# Patient Record
Sex: Female | Born: 1960 | Race: White | Hispanic: No | Marital: Married | Smoking: Never smoker
Health system: Southern US, Community
[De-identification: ages and names within clinical notes are randomized; demographics above are authoritative.]

## PROBLEM LIST (undated history)

## (undated) DIAGNOSIS — Z923 Personal history of irradiation: Secondary | ICD-10-CM

## (undated) DIAGNOSIS — Z9221 Personal history of antineoplastic chemotherapy: Secondary | ICD-10-CM

## (undated) DIAGNOSIS — C801 Malignant (primary) neoplasm, unspecified: Secondary | ICD-10-CM

---

## 2006-03-14 HISTORY — PX: REDUCTION MAMMAPLASTY: SUR839

## 2006-07-18 ENCOUNTER — Ambulatory Visit: Payer: Self-pay

## 2007-07-31 ENCOUNTER — Ambulatory Visit: Payer: Self-pay

## 2007-08-16 ENCOUNTER — Ambulatory Visit: Payer: Self-pay

## 2007-08-27 ENCOUNTER — Ambulatory Visit: Payer: Self-pay

## 2008-06-05 ENCOUNTER — Ambulatory Visit: Payer: Self-pay

## 2009-06-11 ENCOUNTER — Ambulatory Visit: Payer: Self-pay | Admitting: Family Medicine

## 2010-06-15 ENCOUNTER — Ambulatory Visit: Payer: Self-pay | Admitting: Family Medicine

## 2012-03-08 ENCOUNTER — Emergency Department (INDEPENDENT_AMBULATORY_CARE_PROVIDER_SITE_OTHER)
Admission: EM | Admit: 2012-03-08 | Discharge: 2012-03-08 | Disposition: A | Discharge status: Home / Self Care | Payer: 59 | Source: Home / Self Care

## 2012-03-08 ENCOUNTER — Encounter (HOSPITAL_COMMUNITY): Payer: Self-pay

## 2012-03-08 DIAGNOSIS — J040 Acute laryngitis: Secondary | ICD-10-CM

## 2012-03-08 DIAGNOSIS — J069 Acute upper respiratory infection, unspecified: Secondary | ICD-10-CM

## 2012-03-08 MED ORDER — PHENYLEPHRINE-CHLORPHEN-DM 10-4-12.5 MG/5ML PO LIQD: 5.0000 mL | ORAL | Status: DC | PRN

## 2012-03-08 NOTE — ED Provider Notes (Signed)
History     CSN: [id number]  Arrival date & time 03/08/12  1005   None     Chief Complaint  Patient presents with  . URI    (Consider location/radiation/quality/duration/timing/severity/associated sxs/prior treatment) HPI Comments: 51 year old female presents with coughing congestion in the chest for 3-4 days. She is complaining of throat discomfort laryngitis and pain in the chest upon coughing. States yesterday she coughed all day long. She is taking Mucinex. She also feels PND. She is a nonsmoker. Denies fevers, shortness of breath or GI/GU symptoms.   History reviewed. No pertinent past medical history.  History reviewed. No pertinent past surgical history.  History reviewed. No pertinent family history.  History  Substance Use Topics  . Smoking status: Not on file  . Smokeless tobacco: Not on file  . Alcohol Use: Not on file    OB History    Grav Para Term Preterm Abortions TAB SAB Ect Mult Living                  Review of Systems  Constitutional: Negative for fever, chills, activity change, appetite change and fatigue.  HENT: Positive for congestion, sore throat and postnasal drip. Negative for facial swelling, neck pain and neck stiffness.   Eyes: Negative.   Respiratory: Positive for cough.   Cardiovascular: Negative.   Gastrointestinal: Positive for nausea.  Musculoskeletal: Negative.   Skin: Negative for pallor and rash.  Neurological: Negative.     Allergies  Review of patient's allergies indicates no known allergies.  Home Medications   Current Outpatient Rx  Name  Route  Sig  Dispense  Refill  . PHENYLEPHRINE-CHLORPHEN-DM 12-16-10.5 MG/5ML PO LIQD   Oral   Take 5 mLs by mouth every 4 (four) hours as needed.   120 mL   0     BP 100/63  Pulse 72  Temp 98.5 F (36.9 C) (Oral)  Resp 12  SpO2 97%  Physical Exam  Nursing note and vitals reviewed. Constitutional: She is oriented to person, place, and time. She appears well-developed and  well-nourished. No distress.  HENT:       Bilateral TMs are without erythema, or bulging. Both have a degree of retraction. Oropharynx with minor erythema, no exudates Voice is raspy, and now. No stridor  Eyes: Conjunctivae normal and EOM are normal.  Neck: Normal range of motion. Neck supple.  Cardiovascular: Normal rate, regular rhythm and normal heart sounds.   Pulmonary/Chest: Effort normal and breath sounds normal. No respiratory distress. She has no wheezes. She has no rales.       Lungs are perfectly clear no adventitious sounds are heard. Air movement is excellent; inspiratory phase equals expiratory phase; no areas where there is a decreased air movement  Musculoskeletal: Normal range of motion. She exhibits no edema.  Lymphadenopathy:    She has no cervical adenopathy.  Neurological: She is alert and oriented to person, place, and time.  Skin: Skin is warm and dry. No rash noted.  Psychiatric: She has a normal mood and affect.    ED Course  Procedures (including critical care time)  Labs Reviewed - No data to display No results found.   1. URI (upper respiratory infection)       MDM  Norell CS 1 teaspoon every 4 hours when necessary cough and congestion. Drink plenty of fluids stay well hydrated Tylenol every 4 hours or Motrin every 6 hours Local rest Cepacol lozenges as needed. Per any new symptoms problems or worsening especially  shortness of breath, fever, chills and worsening cough may need to be reevaluated.        Hayden Rasmussen, NP 03/08/12 1047

## 2012-03-08 NOTE — ED Provider Notes (Signed)
Medical screening examination/treatment/procedure(s) were performed by non-physician practitioner and as supervising physician I was immediately available for consultation/collaboration.  Leslee Home, M.D.   Reuben Likes, MD 03/08/12 [phone]

## 2012-03-08 NOTE — ED Notes (Signed)
Cough , congestion, hoarse from cough, chest , throat, ear pain

## 2012-12-18 ENCOUNTER — Ambulatory Visit: Payer: Self-pay | Admitting: Family Medicine

## 2014-01-22 ENCOUNTER — Ambulatory Visit: Payer: Self-pay | Admitting: Family Medicine

## 2014-02-03 ENCOUNTER — Ambulatory Visit: Payer: Self-pay | Admitting: Family Medicine

## 2014-07-17 ENCOUNTER — Other Ambulatory Visit: Payer: Self-pay | Admitting: Family Medicine

## 2014-07-17 DIAGNOSIS — R928 Other abnormal and inconclusive findings on diagnostic imaging of breast: Secondary | ICD-10-CM

## 2014-07-18 ENCOUNTER — Other Ambulatory Visit: Payer: Self-pay | Admitting: Family Medicine

## 2014-07-18 DIAGNOSIS — R928 Other abnormal and inconclusive findings on diagnostic imaging of breast: Secondary | ICD-10-CM

## 2014-07-30 ENCOUNTER — Ambulatory Visit
Admission: RE | Admit: 2014-07-30 | Discharge: 2014-07-30 | Disposition: A | Discharge status: Home / Self Care | Payer: 59 | Source: Ambulatory Visit | Attending: Family Medicine | Admitting: Family Medicine

## 2014-07-30 ENCOUNTER — Other Ambulatory Visit: Payer: Self-pay | Admitting: Family Medicine

## 2014-07-30 DIAGNOSIS — R928 Other abnormal and inconclusive findings on diagnostic imaging of breast: Secondary | ICD-10-CM

## 2014-07-30 DIAGNOSIS — N63 Unspecified lump in breast: Secondary | ICD-10-CM | POA: Insufficient documentation

## 2014-08-25 ENCOUNTER — Ambulatory Visit (INDEPENDENT_AMBULATORY_CARE_PROVIDER_SITE_OTHER): Payer: 59

## 2014-08-25 ENCOUNTER — Ambulatory Visit (INDEPENDENT_AMBULATORY_CARE_PROVIDER_SITE_OTHER): Payer: 59 | Admitting: Podiatry

## 2014-08-25 ENCOUNTER — Encounter: Payer: Self-pay | Admitting: Podiatry

## 2014-08-25 VITALS — BP 101/61 | HR 63 | Resp 16

## 2014-08-25 DIAGNOSIS — M79673 Pain in unspecified foot: Secondary | ICD-10-CM

## 2014-08-25 DIAGNOSIS — M722 Plantar fascial fibromatosis: Secondary | ICD-10-CM

## 2014-08-25 NOTE — Progress Notes (Signed)
   Subjective:    Patient ID: Judy Stein, female    DOB: [birth year], 54 y.o.   MRN: [id number]  HPI Comments: "I need new orthotics"  Patient states that she needs new inserts for shoes. She gets twinges of pain in the right foot. She had problems with plantar fasciitis previously. She does get these blisters on bottoms of both feet and is concerned why.      Review of Systems  All other systems reviewed and are negative.      Objective:   Physical Exam: I have reviewed past medical history medications allergies surgeries and social history. Pulses are palpable bilateral. Neurologic sensorium is intact. Deep tendon reflexes are intact bilateral and muscle strength +5 over 5 dorsi plexus lateral flexors and inverters and everters all into the musculature is intact. Orthopedic evaluation of strength all joints distal to the ankle for range of motion without crepitation. Mild tenderness on palpation medial calcaneal tubercle bilateral.        Assessment & Plan:  Assessment: History of plantar fasciitis bilateral.  Plan: She'll scan for new set of orthotics.

## 2014-09-09 ENCOUNTER — Ambulatory Visit (INDEPENDENT_AMBULATORY_CARE_PROVIDER_SITE_OTHER): Payer: 59 | Admitting: *Deleted

## 2014-09-09 DIAGNOSIS — M722 Plantar fascial fibromatosis: Secondary | ICD-10-CM

## 2014-09-09 NOTE — Patient Instructions (Signed)

## 2014-09-09 NOTE — Progress Notes (Signed)
Orthotics dispensed. Instructions given. Recheck in 1 mo., if needed.

## 2014-09-24 ENCOUNTER — Telehealth: Payer: Self-pay | Admitting: *Deleted

## 2014-09-24 NOTE — Telephone Encounter (Signed)
Patient presents with her new orthotics stating that they were to long when they were dispensed she was told to take them home and try them in other shoes, the are too long for any shoe she owns and is afraid to trim them herself.  Orthotics are trimmed slightly and tried in both pairs of shoes she has with her the fit snuggly and patient left satisfied with outcome.  MParry

## 2014-10-13 ENCOUNTER — Ambulatory Visit: Payer: 59 | Admitting: Podiatry

## 2015-08-19 ENCOUNTER — Other Ambulatory Visit: Payer: Self-pay | Admitting: Internal Medicine

## 2015-08-19 DIAGNOSIS — Z1231 Encounter for screening mammogram for malignant neoplasm of breast: Secondary | ICD-10-CM

## 2015-08-24 ENCOUNTER — Ambulatory Visit
Admission: RE | Admit: 2015-08-24 | Discharge: 2015-08-24 | Disposition: A | Discharge status: Home / Self Care | Payer: 59 | Source: Ambulatory Visit | Attending: Internal Medicine | Admitting: Internal Medicine

## 2015-08-24 DIAGNOSIS — Z1231 Encounter for screening mammogram for malignant neoplasm of breast: Secondary | ICD-10-CM | POA: Insufficient documentation

## 2016-08-17 ENCOUNTER — Other Ambulatory Visit: Payer: Self-pay | Admitting: Internal Medicine

## 2016-08-17 DIAGNOSIS — Z1231 Encounter for screening mammogram for malignant neoplasm of breast: Secondary | ICD-10-CM

## 2016-08-29 ENCOUNTER — Ambulatory Visit
Admission: RE | Admit: 2016-08-29 | Discharge: 2016-08-29 | Disposition: A | Discharge status: Home / Self Care | Payer: 59 | Source: Ambulatory Visit | Attending: Internal Medicine | Admitting: Internal Medicine

## 2016-08-29 DIAGNOSIS — Z1231 Encounter for screening mammogram for malignant neoplasm of breast: Secondary | ICD-10-CM | POA: Insufficient documentation

## 2017-07-31 ENCOUNTER — Other Ambulatory Visit: Payer: Self-pay | Admitting: Internal Medicine

## 2017-07-31 DIAGNOSIS — Z1231 Encounter for screening mammogram for malignant neoplasm of breast: Secondary | ICD-10-CM

## 2017-09-04 ENCOUNTER — Ambulatory Visit
Admission: RE | Admit: 2017-09-04 | Discharge: 2017-09-04 | Disposition: A | Discharge status: Home / Self Care | Payer: 59 | Source: Ambulatory Visit | Attending: Internal Medicine | Admitting: Internal Medicine

## 2017-09-04 DIAGNOSIS — Z1231 Encounter for screening mammogram for malignant neoplasm of breast: Secondary | ICD-10-CM | POA: Insufficient documentation

## 2018-08-14 ENCOUNTER — Other Ambulatory Visit: Payer: Self-pay | Admitting: Internal Medicine

## 2018-08-14 DIAGNOSIS — Z1231 Encounter for screening mammogram for malignant neoplasm of breast: Secondary | ICD-10-CM

## 2018-09-06 ENCOUNTER — Other Ambulatory Visit: Payer: Self-pay

## 2018-09-06 ENCOUNTER — Ambulatory Visit
Admission: RE | Admit: 2018-09-06 | Discharge: 2018-09-06 | Disposition: A | Discharge status: Home / Self Care | Payer: 59 | Source: Ambulatory Visit | Attending: Internal Medicine | Admitting: Internal Medicine

## 2018-09-06 DIAGNOSIS — Z1231 Encounter for screening mammogram for malignant neoplasm of breast: Secondary | ICD-10-CM | POA: Insufficient documentation

## 2020-03-24 ENCOUNTER — Other Ambulatory Visit: Payer: Self-pay | Admitting: Internal Medicine

## 2020-03-24 DIAGNOSIS — Z1231 Encounter for screening mammogram for malignant neoplasm of breast: Secondary | ICD-10-CM

## 2020-04-14 ENCOUNTER — Encounter (INDEPENDENT_AMBULATORY_CARE_PROVIDER_SITE_OTHER): Payer: Self-pay

## 2020-04-14 ENCOUNTER — Other Ambulatory Visit: Payer: Self-pay

## 2020-04-14 ENCOUNTER — Ambulatory Visit
Admission: RE | Admit: 2020-04-14 | Discharge: 2020-04-14 | Disposition: A | Discharge status: Home / Self Care | Payer: 59 | Source: Ambulatory Visit | Attending: Internal Medicine | Admitting: Internal Medicine

## 2020-04-14 DIAGNOSIS — Z1231 Encounter for screening mammogram for malignant neoplasm of breast: Secondary | ICD-10-CM

## 2020-04-14 HISTORY — DX: Malignant (primary) neoplasm, unspecified: C80.1

## 2020-04-14 HISTORY — DX: Personal history of antineoplastic chemotherapy: Z92.21

## 2020-04-14 HISTORY — DX: Personal history of irradiation: Z92.3

## 2021-03-31 ENCOUNTER — Other Ambulatory Visit: Payer: Self-pay | Admitting: Internal Medicine

## 2021-03-31 DIAGNOSIS — Z1231 Encounter for screening mammogram for malignant neoplasm of breast: Secondary | ICD-10-CM

## 2021-04-28 ENCOUNTER — Ambulatory Visit
Admission: RE | Admit: 2021-04-28 | Discharge: 2021-04-28 | Disposition: A | Discharge status: Home / Self Care | Payer: 59 | Source: Ambulatory Visit | Attending: Internal Medicine | Admitting: Internal Medicine

## 2021-04-28 ENCOUNTER — Other Ambulatory Visit: Payer: Self-pay

## 2021-04-28 DIAGNOSIS — Z1231 Encounter for screening mammogram for malignant neoplasm of breast: Secondary | ICD-10-CM | POA: Diagnosis not present

## 2022-04-12 ENCOUNTER — Other Ambulatory Visit: Payer: Self-pay | Admitting: Internal Medicine

## 2022-04-12 DIAGNOSIS — Z1231 Encounter for screening mammogram for malignant neoplasm of breast: Secondary | ICD-10-CM

## 2022-05-03 ENCOUNTER — Ambulatory Visit
Admission: RE | Admit: 2022-05-03 | Discharge: 2022-05-03 | Disposition: A | Discharge status: Home / Self Care | Payer: 59 | Source: Ambulatory Visit | Attending: Internal Medicine | Admitting: Internal Medicine

## 2022-05-03 DIAGNOSIS — Z1231 Encounter for screening mammogram for malignant neoplasm of breast: Secondary | ICD-10-CM | POA: Insufficient documentation

## 2022-05-24 IMAGING — MG MM DIGITAL SCREENING BILAT W/ TOMO AND CAD
8 series · 8 of 24 positions shown · non-contrast
Comparison: Previous exam(s).

CLINICAL DATA: Screening.

EXAM:
DIGITAL SCREENING BILATERAL MAMMOGRAM WITH TOMOSYNTHESIS AND CAD
TECHNIQUE: Bilateral screening digital craniocaudal and mediolateral oblique
mammograms were obtained. Bilateral screening digital breast
tomosynthesis was performed. The images were evaluated with
computer-aided detection.

[L CC synth-2D]
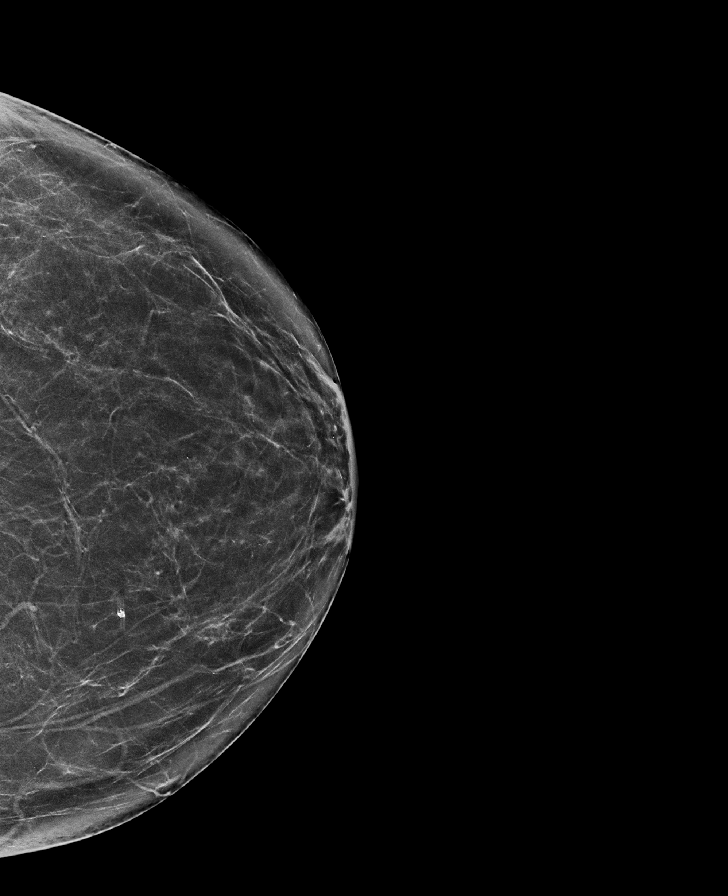

[R CC synth-2D]
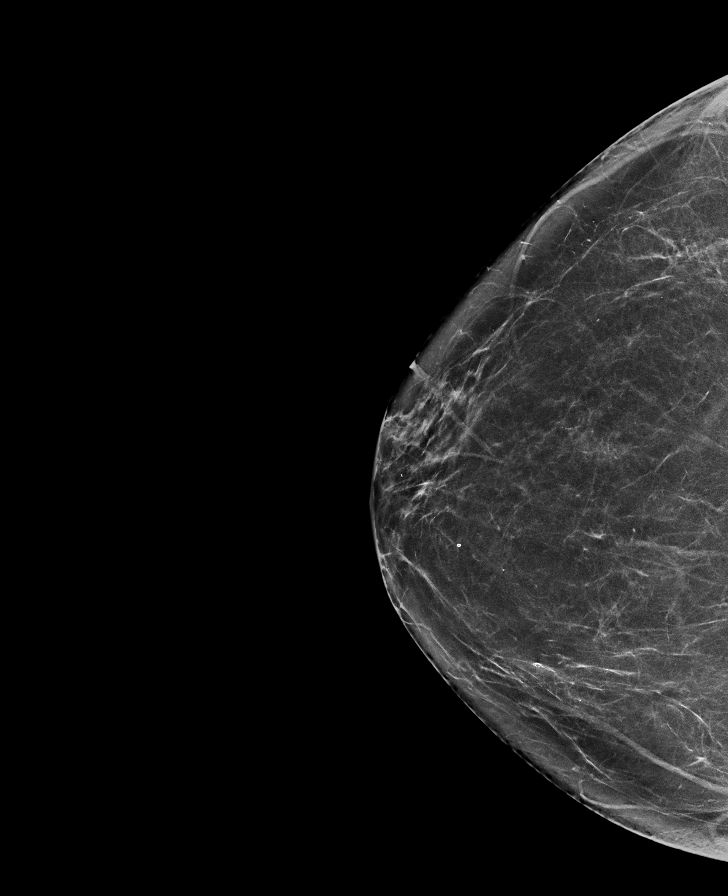

[R MLO synth-2D]
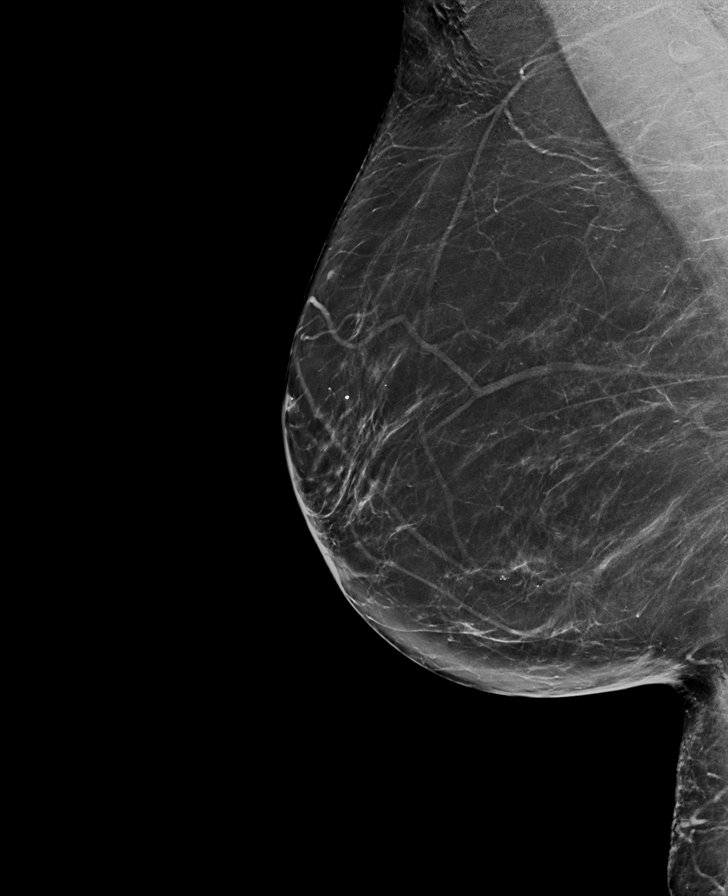

[L MLO synth-2D]
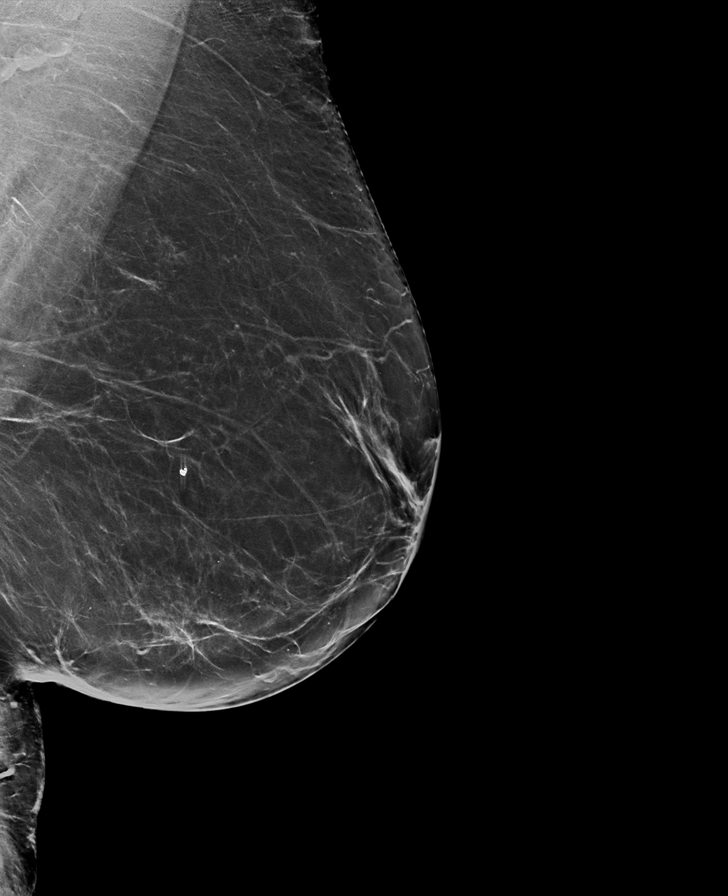

[L MLO tomo · tomo slice 40/79.0]
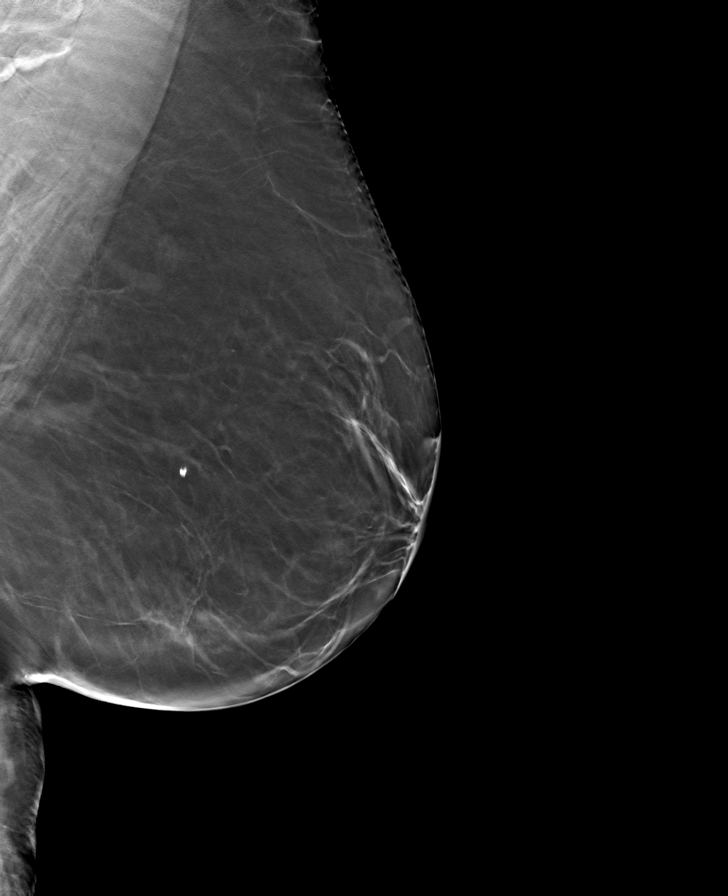

[L CC tomo · tomo slice 37/73.0]
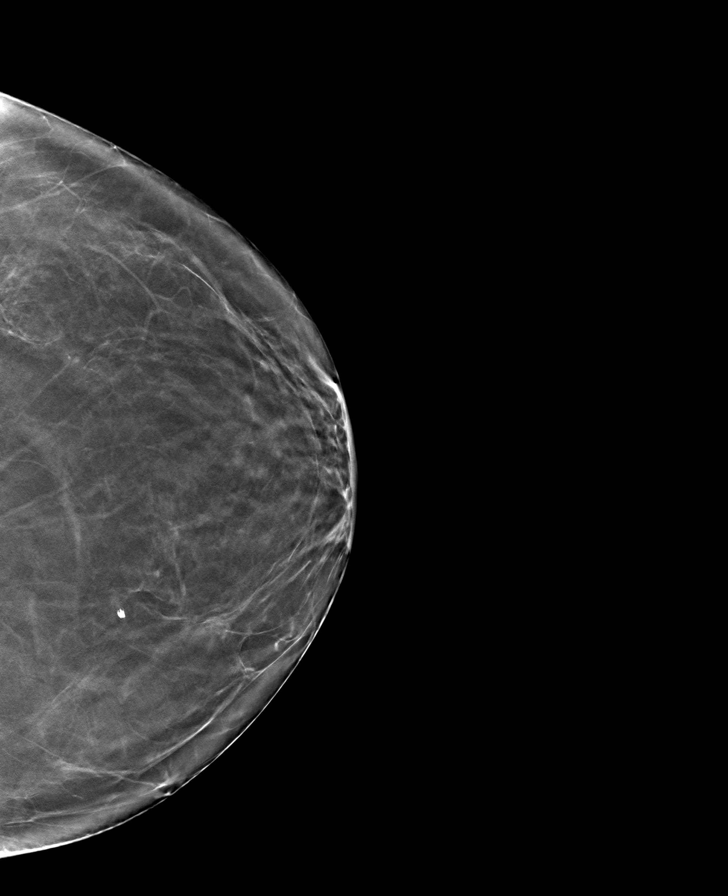

[R CC tomo · tomo slice 37/72.0]
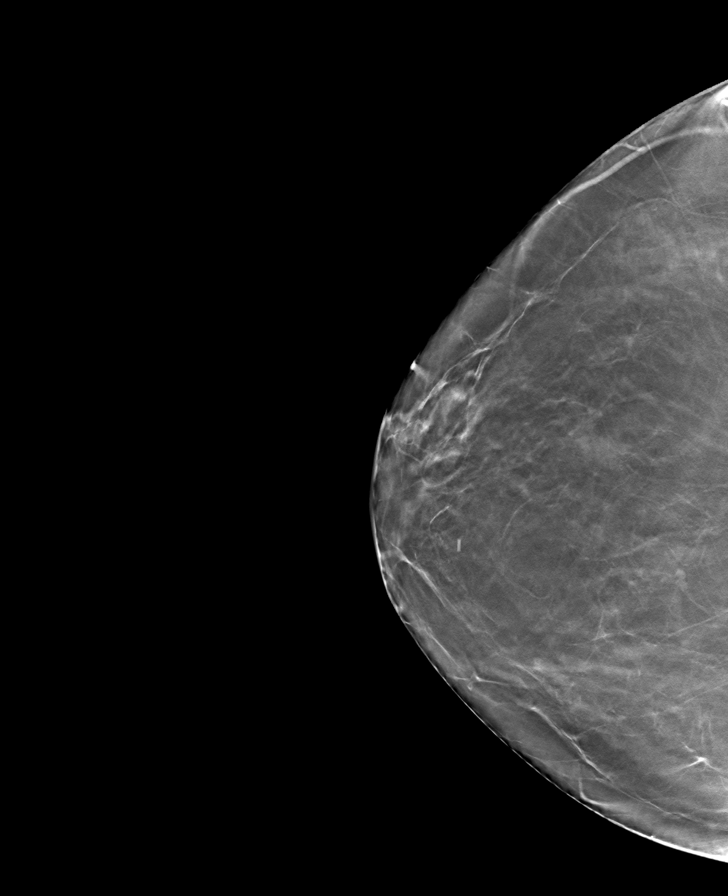

[R MLO tomo · tomo slice 39/78.0]
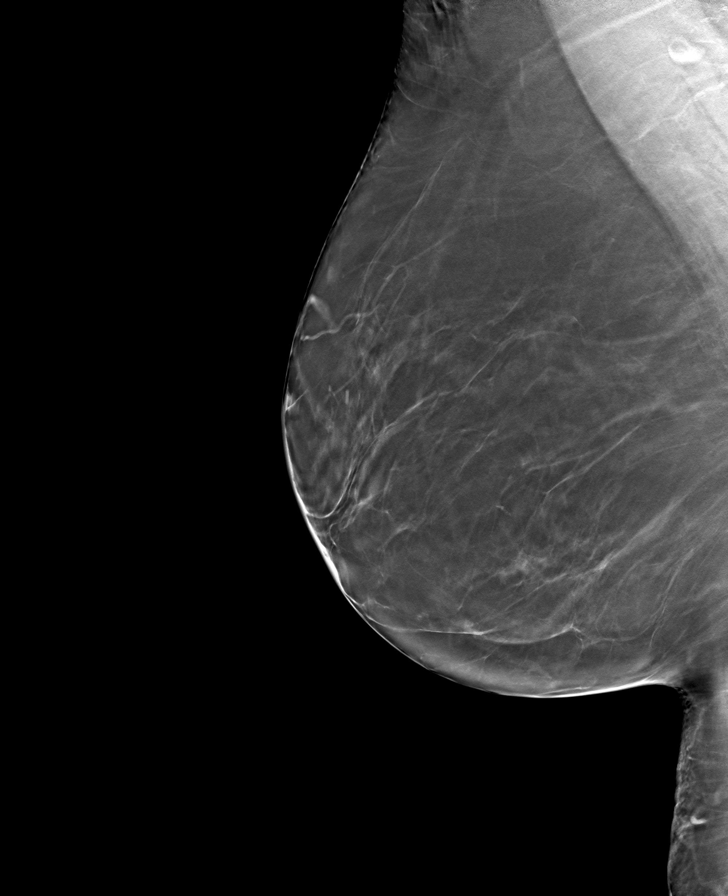

[8 of 24 positions shown; findings below may reference images not displayed]

ACR Breast Density Category b: There are scattered areas of
fibroglandular density.
FINDINGS: There are no findings suspicious for malignancy.
IMPRESSION: No mammographic evidence of malignancy. A result letter of this
screening mammogram will be mailed directly to the patient.

RECOMMENDATION:
Screening mammogram in one year. (Code:N8-Q-MV3)

BI-RADS CATEGORY  1: Negative.

tPlease Insert Correct Screening Template

## 2023-06-14 ENCOUNTER — Other Ambulatory Visit: Payer: Self-pay | Admitting: Internal Medicine

## 2023-06-14 DIAGNOSIS — Z1231 Encounter for screening mammogram for malignant neoplasm of breast: Secondary | ICD-10-CM

## 2023-06-15 ENCOUNTER — Ambulatory Visit
Admission: RE | Admit: 2023-06-15 | Discharge: 2023-06-15 | Disposition: A | Discharge status: Home / Self Care | Source: Ambulatory Visit | Attending: Internal Medicine | Admitting: Internal Medicine

## 2023-06-15 DIAGNOSIS — Z1231 Encounter for screening mammogram for malignant neoplasm of breast: Secondary | ICD-10-CM | POA: Insufficient documentation
